# Patient Record
Sex: Female | Born: 1985 | Race: Black or African American | Hispanic: No | Marital: Single | Smoking: Former smoker
Health system: Southern US, Community
[De-identification: ages and names within clinical notes are randomized; demographics above are authoritative.]

## PROBLEM LIST (undated history)

## (undated) DIAGNOSIS — J45909 Unspecified asthma, uncomplicated: Secondary | ICD-10-CM

## (undated) HISTORY — PX: BREAST SURGERY: SHX581

---

## 2019-07-17 ENCOUNTER — Emergency Department (HOSPITAL_BASED_OUTPATIENT_CLINIC_OR_DEPARTMENT_OTHER)
Admission: EM | Admit: 2019-07-17 | Discharge: 2019-07-17 | Disposition: A | Discharge status: Home / Self Care | Payer: Self-pay | Attending: Emergency Medicine | Admitting: Emergency Medicine

## 2019-07-17 ENCOUNTER — Encounter (HOSPITAL_BASED_OUTPATIENT_CLINIC_OR_DEPARTMENT_OTHER): Payer: Self-pay

## 2019-07-17 ENCOUNTER — Emergency Department (HOSPITAL_BASED_OUTPATIENT_CLINIC_OR_DEPARTMENT_OTHER): Payer: Self-pay

## 2019-07-17 ENCOUNTER — Other Ambulatory Visit: Payer: Self-pay

## 2019-07-17 DIAGNOSIS — J45909 Unspecified asthma, uncomplicated: Secondary | ICD-10-CM | POA: Insufficient documentation

## 2019-07-17 DIAGNOSIS — R072 Precordial pain: Secondary | ICD-10-CM

## 2019-07-17 DIAGNOSIS — R0789 Other chest pain: Secondary | ICD-10-CM | POA: Insufficient documentation

## 2019-07-17 DIAGNOSIS — F17210 Nicotine dependence, cigarettes, uncomplicated: Secondary | ICD-10-CM | POA: Insufficient documentation

## 2019-07-17 DIAGNOSIS — R0602 Shortness of breath: Secondary | ICD-10-CM | POA: Insufficient documentation

## 2019-07-17 HISTORY — DX: Unspecified asthma, uncomplicated: J45.909

## 2019-07-17 MED ORDER — ALBUTEROL SULFATE HFA 108 (90 BASE) MCG/ACT IN AERS: 2.0000 | INHALATION_SPRAY | RESPIRATORY_TRACT | 1 refills | Status: AC | PRN

## 2019-07-17 NOTE — ED Triage Notes (Signed)
Pt arrived via [facility] from work where pt was smoking and started having chest pain and SOB. Ambulated inside and had someone call 911 for her. History of asthma. Pt is out of Albuterol inhaler. Was given 5 mg Albuterol Neb by EMS PTA.

## 2019-07-17 NOTE — Discharge Instructions (Signed)
Please read and follow all provided instructions.  Your diagnoses today include:  1. Shortness of breath   2. Precordial pain     Tests performed today include:  An EKG of your heart  A chest x-ray  Vital signs. See below for your results today.   Medications prescribed:   Albuterol inhaler - medication that opens up your airway  Use inhaler as follows: 1-2 puffs every 4 hours as needed for wheezing, cough, or shortness of breath.   Take any prescribed medications only as directed.  Follow-up instructions: Please follow-up with your primary care provider as soon as you can for further evaluation of your symptoms.   Return instructions:  SEEK IMMEDIATE MEDICAL ATTENTION IF:  You have severe chest pain, especially if the pain is crushing or pressure-like and spreads to the arms, back, neck, or jaw, or if you have sweating, nausea (feeling sick to your stomach), or shortness of breath. THIS IS AN EMERGENCY. Don't wait to see if the pain will go away. Get medical help at once. Call 911 or 0 (operator). DO NOT drive yourself to the hospital.   You wake from sleep with chest pain or shortness of breath.  You feel dizzy or faint.  You have chest pain not typical of your usual pain for which you originally saw your caregiver.   You have any other emergent concerns regarding your health.  Additional Information: Chest pain comes from many different causes. Your caregiver has diagnosed you as having chest pain that is not specific for one problem, but does not require admission.  You are at low risk for an acute heart condition or other serious illness.   Your vital signs today were: BP (!) 129/56 (BP Location: Right Arm)   Pulse 71   Temp 98.4 F (36.9 C) (Oral)   Resp (!) 24   Ht 5\' 3"  (1.6 m)   Wt 107 kg   SpO2 99%   BMI 41.81 kg/m  If your blood pressure (BP) was elevated above 135/85 this visit, please have this repeated by your doctor within one  month. --------------

## 2019-07-17 NOTE — ED Provider Notes (Signed)
[facility] EMERGENCY DEPARTMENT Provider Note   CSN: [id number] Arrival date & time: 07/17/19  1633     History Chief Complaint  Patient presents with  . Shortness of Breath    Alyssa Shepard is a 34 y.o. female.  Patient with no past cardiac history presents to the emergency department today for shortness of breath and chest pain.  Patient has a history of asthma and does not currently have an inhaler.  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  It was raining outside.  She was smoking a cigarette and had onset of mid chest pain with associated shortness of breath and wheezing.  EMS was called for transport.  Patient was given 5 mg of albuterol by EMS with resolution of symptoms.  Patient had a similar short-lived episode last night, again while smoking which resolved spontaneously.  Pain did not radiate.  There is no associated vomiting or diaphoresis.  Patient denies history of hypertension, high cholesterol, diabetes, family history of coronary artery disease. Patient denies risk factors for pulmonary embolism including: unilateral leg swelling, history of DVT/PE/other blood clots, use of exogenous hormones, recent immobilizations, recent surgery, recent travel (>4hr segment), malignancy, hemoptysis.  No abdominal pain.        Past Medical History:  Diagnosis Date  . Asthma     There are no problems to display for this patient.   Past Surgical History:  Procedure Laterality Date  . BREAST SURGERY       OB History   No obstetric history on file.     No family history on file.  Social History   Tobacco Use  . Smoking status: Current Every Day Smoker    Packs/day: 0.50    Types: Cigarettes  . Smokeless tobacco: Never Used  Substance Use Topics  . Alcohol use: Never  . Drug use: Never    Home Medications Prior to Admission medications   Not on File    Allergies    Patient has no known  allergies.  Review of Systems   Review of Systems  Constitutional: Negative for fever.  HENT: Negative for rhinorrhea and sore throat.   Eyes: Negative for redness.  Respiratory: Positive for shortness of breath and wheezing. Negative for cough.   Cardiovascular: Positive for chest pain.  Gastrointestinal: Negative for abdominal pain, diarrhea, nausea and vomiting.  Genitourinary: Negative for dysuria.  Musculoskeletal: Negative for myalgias.  Skin: Negative for rash.  Neurological: Negative for syncope, light-headedness and headaches.    Physical Exam Updated Vital Signs BP (!) 129/56 (BP Location: Right Arm)   Pulse 71   Temp 98.4 F (36.9 C) (Oral)   Resp (!) 24   Ht 5\' 3"  (1.6 m)   Wt 107 kg   SpO2 99%   BMI 41.81 kg/m   Physical Exam Vitals and nursing note reviewed.  Constitutional:      Appearance: She is well-developed. She is not diaphoretic.  HENT:     Head: Normocephalic and atraumatic.     Mouth/Throat:     Mouth: Mucous membranes are not dry.  Eyes:     Conjunctiva/sclera: Conjunctivae normal.  Neck:     Vascular: Normal carotid pulses. No carotid bruit or JVD.     Trachea: Trachea normal. No tracheal deviation.  Cardiovascular:     Rate and Rhythm: Normal rate and regular rhythm.     Pulses: No decreased pulses.     Heart sounds:  Normal heart sounds, S1 normal and S2 normal. No murmur.  Pulmonary:     Effort: Pulmonary effort is normal. No respiratory distress.     Breath sounds: No wheezing.  Chest:     Chest wall: No tenderness.  Abdominal:     General: Bowel sounds are normal.     Palpations: Abdomen is soft.     Tenderness: There is no abdominal tenderness. There is no guarding or rebound.  Musculoskeletal:        General: Normal range of motion.     Cervical back: Normal range of motion and neck supple. No muscular tenderness.  Skin:    General: Skin is warm and dry.     Coloration: Skin is not pale.  Neurological:     Mental Status:  She is alert.     ED Results / Procedures / Treatments   Labs (all labs ordered are listed, but only abnormal results are displayed) Labs Reviewed - No data to display  ED ECG REPORT   Date: 07/17/2019  Rate: 70  Rhythm: normal sinus rhythm  QRS Axis: normal  Intervals: normal  ST/T Wave abnormalities: normal  Conduction Disutrbances:none  Narrative Interpretation:   Old EKG Reviewed: none available  I have personally reviewed the EKG tracing and agree with the computerized printout as noted.  Radiology DG Chest 2 View  Result Date: 07/17/2019 CLINICAL DATA:  Sudden onset of mid-sternal chest pain just PTA while smoking, some SOB at that time, symptoms mostly resolved after albuterol treatment here Hx asthma EXAM: CHEST - 2 VIEW COMPARISON:  None. FINDINGS: The heart size and mediastinal contours are within normal limits. The lungs are clear. No pneumothorax or pleural effusion. The visualized skeletal structures are unremarkable. IMPRESSION: No acute cardiopulmonary finding. Electronically Signed   By: Emmaline Kluver M.D.   On: 07/17/2019 17:19    Procedures Procedures (including critical care time)  Medications Ordered in ED Medications - No data to display  ED Course  I have reviewed the triage vital signs and the nursing notes.  Pertinent labs & imaging results that were available during my care of the patient were reviewed by me and considered in my medical decision making (see chart for details).  Patient seen and examined. Work-up initiated.  Symptoms are resolved.  Will obtain chest x-ray to ensure no signs of pneumothorax or infection.  Will obtain EKG.  Symptoms are not suggestive of ACS and patient has low risk risk factor profile  Vital signs reviewed and are as follows: BP (!) 129/56 (BP Location: Right Arm)   Pulse 71   Temp 98.4 F (36.9 C) (Oral)   Resp (!) 24   Ht 5\' 3"  (1.6 m)   Wt 107 kg   SpO2 99%   BMI 41.81 kg/m   5:22 PM EKG and chest  x-ray are reassuring.  Plan for discharge home with albuterol inhaler.  Patient comfortable with plan.  Discussed low-risk for ACS, PE at this time.  Encourage PCP follow-up if symptoms persist.   Patient was counseled to return with severe chest pain, especially if the pain is crushing or pressure-like and spreads to the arms, back, neck, or jaw, or if they have sweating, nausea, or shortness of breath with the pain. They were encouraged to call 911 with these symptoms.     MDM Rules/Calculators/A&P                      Patient presents with complaint of  chest pain wheezing and shortness of breath which started while smoking.  Symptoms are atypical for ACS.  She has a low risk profile for this.  She does not have any signs or symptoms of DVT or PE.  PERC negative.  Patient's symptoms resolved with albuterol.  Suspect an element of reactive airways.  EKG is completely normal and chest x-ray without any signs of pneumothorax or other problems.  Patient remains symptom-free during ED stay.  Will have her follow-up as needed.  Rx for albuterol.  Discussed return instructions with patient as he seems reliable to return with any changes or worsening.   Final Clinical Impression(s) / ED Diagnoses Final diagnoses:  Shortness of breath  Precordial pain    Rx / DC Orders ED Discharge Orders         Ordered    albuterol (VENTOLIN HFA) 108 (90 Base) MCG/ACT inhaler  Every 4 hours PRN     07/17/19 1730           Carlisle Cater, PA-C 07/17/19 1734    Malvin Johns, MD 07/17/19 [phone]

## 2019-07-28 ENCOUNTER — Encounter (HOSPITAL_BASED_OUTPATIENT_CLINIC_OR_DEPARTMENT_OTHER): Payer: Self-pay

## 2019-07-28 ENCOUNTER — Other Ambulatory Visit: Payer: Self-pay

## 2019-07-28 DIAGNOSIS — N611 Abscess of the breast and nipple: Secondary | ICD-10-CM | POA: Insufficient documentation

## 2019-07-28 NOTE — ED Triage Notes (Addendum)
Pt c/o left breast abscess x 2 weeks-also c/o ?abscess to left cheek-also c/o HA-NAD-steady gait

## 2019-07-29 ENCOUNTER — Encounter (HOSPITAL_BASED_OUTPATIENT_CLINIC_OR_DEPARTMENT_OTHER): Payer: Self-pay | Admitting: Emergency Medicine

## 2019-07-29 ENCOUNTER — Emergency Department (HOSPITAL_BASED_OUTPATIENT_CLINIC_OR_DEPARTMENT_OTHER)
Admission: EM | Admit: 2019-07-29 | Discharge: 2019-07-29 | Disposition: A | Discharge status: Home / Self Care | Payer: Medicaid - Out of State | Attending: Emergency Medicine | Admitting: Emergency Medicine

## 2019-07-29 DIAGNOSIS — N611 Abscess of the breast and nipple: Secondary | ICD-10-CM

## 2019-07-29 MED ORDER — SULFAMETHOXAZOLE-TRIMETHOPRIM 800-160 MG PO TABS
1.0000 | ORAL_TABLET | Freq: Two times a day (BID) | ORAL | 0 refills | Status: DC
Start: 2019-07-29 — End: 2019-08-01

## 2019-07-29 MED ORDER — SULFAMETHOXAZOLE-TRIMETHOPRIM 800-160 MG PO TABS
1.0000 | ORAL_TABLET | Freq: Once | ORAL | Status: AC
  Administered 2019-07-29: 1 via ORAL
  Filled 2019-07-29: qty 1

## 2019-07-29 NOTE — ED Provider Notes (Signed)
[facility] EMERGENCY DEPARTMENT Provider Note   CSN: [id number] Arrival date & time: 07/28/19  2222     History Chief Complaint  Patient presents with  . Abscess    Alyssa Shepard is a 34 y.o. female.  The history is provided by the patient.  Abscess Abscess location: patient reports recurrent left breast cyst  Abscess quality: painful   Abscess quality: not draining, no itching, no redness, no warmth and not weeping   Red streaking: no   Duration:  2 weeks Progression:  Worsening Pain details:    Quality:  Dull   Severity:  Moderate   Duration:  2 weeks   Timing:  Constant Chronicity:  Recurrent Context: not diabetes   Relieved by:  Nothing Worsened by:  Nothing Ineffective treatments:  None tried Associated symptoms: no anorexia, no fatigue, no fever, no headaches, no nausea and no vomiting   Risk factors: no family hx of MRSA, no hx of MRSA and no prior abscess        Past Medical History:  Diagnosis Date  . Asthma     There are no problems to display for this patient.   Past Surgical History:  Procedure Laterality Date  . BREAST SURGERY       OB History   No obstetric history on file.     History reviewed. No pertinent family history.  Social History   Tobacco Use  . Smoking status: Former Smoker    Packs/day: 0.50    Types: Cigarettes    Quit date: 07/17/2019    Years since quitting: 0.0  . Smokeless tobacco: Never Used  Vaping Use  . Vaping Use: Never used  Substance Use Topics  . Alcohol use: Never  . Drug use: Never    Home Medications Prior to Admission medications   Medication Sig Start Date End Date Taking? Authorizing Provider  albuterol (VENTOLIN HFA) 108 (90 Base) MCG/ACT inhaler Inhale 2 puffs into the lungs every 4 (four) hours as needed for wheezing or shortness of breath. 07/17/19   Renne Crigler, PA-C    Allergies    Patient has no known allergies.  Review of Systems   Review of Systems    Constitutional: Negative for fatigue and fever.  HENT: Negative for congestion.   Eyes: Negative for visual disturbance.  Respiratory: Negative for shortness of breath.   Cardiovascular: Negative for chest pain.  Gastrointestinal: Negative for anorexia, nausea and vomiting.  Genitourinary: Negative for difficulty urinating.  Musculoskeletal: Negative for arthralgias.  Skin: Negative for rash.  Neurological: Negative for headaches.  Psychiatric/Behavioral: Negative for agitation.  All other systems reviewed and are negative.   Physical Exam Updated Vital Signs BP 125/87 (BP Location: Right Arm)   Pulse 89   Temp 98.6 F (37 C) (Oral)   Resp 18   Ht 5\' 3"  (1.6 m)   Wt 105.2 kg   SpO2 100%   BMI 41.10 kg/m   Physical Exam Vitals and nursing note reviewed. Exam conducted with a chaperone present.  Constitutional:      General: She is not in acute distress.    Appearance: Normal appearance.  HENT:     Head: Normocephalic and atraumatic.     Nose: Nose normal.  Eyes:     Conjunctiva/sclera: Conjunctivae normal.     Pupils: Pupils are equal, round, and reactive to light.  Cardiovascular:     Rate and Rhythm: Normal rate and regular rhythm.     Pulses: Normal pulses.  Heart sounds: Normal heart sounds.  Pulmonary:     Effort: Pulmonary effort is normal.     Breath sounds: Normal breath sounds.  Chest:     Breasts:        Right: No inverted nipple.        Left: No swelling, bleeding, inverted nipple, mass or nipple discharge.    Abdominal:     General: Abdomen is flat. Bowel sounds are normal.     Tenderness: There is no abdominal tenderness. There is no guarding.  Musculoskeletal:        General: Normal range of motion.     Cervical back: Normal range of motion and neck supple.  Skin:    General: Skin is warm and dry.     Capillary Refill: Capillary refill takes less than 2 seconds.  Neurological:     General: No focal deficit present.     Mental Status: She  is alert and oriented to person, place, and time.     Deep Tendon Reflexes: Reflexes normal.  Psychiatric:        Mood and Affect: Mood normal.        Behavior: Behavior normal.     ED Results / Procedures / Treatments   Labs (all labs ordered are listed, but only abnormal results are displayed) Labs Reviewed - No data to display  EKG None  Radiology No results found.  Procedures Procedures (including critical care time)  Medications Ordered in ED Medications  sulfamethoxazole-trimethoprim (BACTRIM DS) 800-160 MG per tablet 1 tablet (has no administration in time range)    ED Course  I have reviewed the triage vital signs and the nursing notes.  Pertinent labs & imaging results that were available during my care of the patient were reviewed by me and considered in my medical decision making (see chart for details).   [facility] for imaging and further care of recurrent breast abscesses.  Will start antibiotics.    Alyssa Shepard was evaluated in Emergency Department on 07/29/2019 for the symptoms described in the history of present illness. She was evaluated in the context of the global COVID-19 pandemic, which necessitated consideration that the patient might be at risk for infection with the SARS-CoV-2 virus that causes COVID-19. Institutional protocols and algorithms that pertain to the evaluation of patients at risk for COVID-19 are in a state of rapid change based on information released by regulatory bodies including the CDC and federal and state organizations. These policies and algorithms were followed during the patient's care in the ED.   MDM Rules/Calculators/A&P                         Return for intractable cough, coughing up blood,fevers >100.4 unrelieved by medication, shortness of breath, intractable vomiting, chest pain, shortness of breath, weakness,numbness, changes in speech, facial  asymmetry,abdominal pain, passing out,Inability to tolerate liquids or food, cough, altered mental status or any concerns. No signs of systemic illness or infection. The patient is nontoxic-appearing on exam and vital signs are within normal limits.   I have reviewed the triage vital signs and the nursing notes. Pertinent labs &imaging results that were available during my care of the patient were reviewed by me and considered in my medical decision making (see chart for details).After history, exam, and medical workup I feel the patient has beenappropriately medically screened and is safe for discharge home. Pertinent diagnoses were discussed with  the patient. Patient was given return precautions.     Elsie Sakuma, MD 07/29/19 [phone]

## 2019-08-01 ENCOUNTER — Other Ambulatory Visit: Payer: Self-pay

## 2019-08-01 ENCOUNTER — Encounter (HOSPITAL_COMMUNITY): Payer: Self-pay | Admitting: Emergency Medicine

## 2019-08-01 ENCOUNTER — Emergency Department (HOSPITAL_COMMUNITY)
Admission: EM | Admit: 2019-08-01 | Discharge: 2019-08-01 | Disposition: A | Discharge status: Home / Self Care | Payer: Medicaid - Out of State | Attending: Emergency Medicine | Admitting: Emergency Medicine

## 2019-08-01 DIAGNOSIS — Z87891 Personal history of nicotine dependence: Secondary | ICD-10-CM | POA: Insufficient documentation

## 2019-08-01 DIAGNOSIS — J45909 Unspecified asthma, uncomplicated: Secondary | ICD-10-CM | POA: Insufficient documentation

## 2019-08-01 DIAGNOSIS — N6320 Unspecified lump in the left breast, unspecified quadrant: Secondary | ICD-10-CM | POA: Insufficient documentation

## 2019-08-01 DIAGNOSIS — N611 Abscess of the breast and nipple: Secondary | ICD-10-CM | POA: Diagnosis not present

## 2019-08-01 DIAGNOSIS — N644 Mastodynia: Secondary | ICD-10-CM | POA: Diagnosis present

## 2019-08-01 MED ORDER — ACETAMINOPHEN 500 MG PO TABS: 500.0000 mg | ORAL_TABLET | Freq: Four times a day (QID) | ORAL | 0 refills | Status: AC | PRN

## 2019-08-01 MED ORDER — IBUPROFEN 200 MG PO TABS
600.0000 mg | ORAL_TABLET | Freq: Once | ORAL | Status: AC
  Administered 2019-08-01: 600 mg via ORAL
  Filled 2019-08-01: qty 3

## 2019-08-01 MED ORDER — IBUPROFEN 600 MG PO TABS: 600.0000 mg | ORAL_TABLET | Freq: Four times a day (QID) | ORAL | 0 refills | Status: AC | PRN

## 2019-08-01 MED ORDER — SULFAMETHOXAZOLE-TRIMETHOPRIM 800-160 MG PO TABS
1.0000 | ORAL_TABLET | Freq: Two times a day (BID) | ORAL | 0 refills | Status: AC
Start: 2019-08-01 — End: 2019-08-08

## 2019-08-01 MED ORDER — SULFAMETHOXAZOLE-TRIMETHOPRIM 800-160 MG PO TABS
1.0000 | ORAL_TABLET | Freq: Once | ORAL | Status: AC
  Administered 2019-08-01: 1 via ORAL
  Filled 2019-08-01: qty 1

## 2019-08-01 NOTE — ED Triage Notes (Signed)
t reports for past 2 weeks had swelling to left breast. Reports pain and size gotten worse. Was seen at [facility] and told needs to see a Careers adviser.

## 2019-08-01 NOTE — Discharge Instructions (Signed)
Please take all of your antibiotics until finished!   Take your antibiotics with food.  Common side effects of antibiotics include nausea, vomiting, abdominal discomfort, and diarrhea. You may help offset some of this with probiotics which you can buy or get in yogurt. Do not eat  or take the probiotics until 2 hours after your antibiotic.    Some studies suggest that certain antibiotics can reduce the efficacy of certain oral contraceptive pills (birth control), so please use additional contraceptives (condoms or other barrier method) while you are taking the antibiotics and for an additional 5 to 7 days afterwards if you are a female on these medications.  Alternate 600 mg of ibuprofen and [phone] mg of Tylenol every 3 hours as needed for pain. Do not exceed 4000 mg of Tylenol daily.  Take ibuprofen with food to avoid upset stomach issues.  Apply warm or cool compresses throughout the day whichever feels better.  Please call the breast center and the general surgeon tomorrow first thing in the morning to schedule follow-up.  Let them know that we did a bedside ultrasound which shows a likely abscess that will require further evaluation and drainage.  Return to the emergency department if any concerning signs or symptoms develop such as fevers, vomiting, chest pain, shortness of breath, worsening spread of redness.

## 2019-08-01 NOTE — ED Provider Notes (Signed)
[facility] DEPT Provider Note   CSN: [id number] Arrival date & time: 08/01/19  1412     History Chief Complaint  Patient presents with  . Breast Pain    left    Alyssa Shepard is a 34 y.o. female with history of asthma presents for evaluation of acute onset, progressively worsening left breast pain and swelling for 2 weeks.  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  She reports symptoms feel similar to that time.  Reports sharp pains that worsen primarily with movement and lifting while at work at the [employer] but improves with rest.  She has not taken anything for her symptoms but has been applying warm compresses with little bit of relief.  No drainage from the area or nipple drainage per her report.  She denies chest pain, shortness of breath, fevers, abdominal pain, nausea, or vomiting.  She was seen at [facility] for this 3 days ago and was instructed to follow-up with general surgery and PCP outpatient but reports that she was unable to get through on the phone to set up the appointments.  She was prescribed Bactrim when she was seen at [facility] 3 days ago but reports that she did not fill the antibiotic because "I did not think it would help".  She is a current smoker.  She reports 2 aunts have a history of breast cancer but no first-degree relatives.  The history is provided by the patient.       Past Medical History:  Diagnosis Date  . Asthma     There are no problems to display for this patient.   Past Surgical History:  Procedure Laterality Date  . BREAST SURGERY       OB History   No obstetric history on file.     No family history on file.  Social History   Tobacco Use  . Smoking status: Former Smoker    Packs/day: 0.50    Types: Cigarettes    Quit date: 07/17/2019    Years since quitting: 0.0  .  Smokeless tobacco: Never Used  Vaping Use  . Vaping Use: Never used  Substance Use Topics  . Alcohol use: Never  . Drug use: Never    Home Medications Prior to Admission medications   Medication Sig Start Date End Date Taking? Authorizing Provider  acetaminophen (TYLENOL) 500 MG tablet Take 1 tablet (500 mg total) by mouth every 6 (six) hours as needed. 08/01/19   Farris Geiman A, PA-C  albuterol (VENTOLIN HFA) 108 (90 Base) MCG/ACT inhaler Inhale 2 puffs into the lungs every 4 (four) hours as needed for wheezing or shortness of breath. 07/17/19   Carlisle Cater, PA-C  ibuprofen (ADVIL) 600 MG tablet Take 1 tablet (600 mg total) by mouth every 6 (six) hours as needed. 08/01/19   Phyllis Whitefield A, PA-C  sulfamethoxazole-trimethoprim (BACTRIM DS) 800-160 MG tablet Take 1 tablet by mouth 2 (two) times daily for 7 days. 08/01/19 08/08/19  Rodell Perna A, PA-C    Allergies    Patient has no known allergies.  Review of Systems   Review of Systems  Constitutional: Negative for chills and fever.  Respiratory: Negative for shortness of breath.   Cardiovascular: Negative for chest pain.  Gastrointestinal: Negative for abdominal pain, diarrhea, nausea and vomiting.  Genitourinary:       +breast pain  All other systems reviewed and are negative.   Physical Exam Updated Vital Signs BP (!) 149/79   Pulse 86   Temp 99.2 F (37.3 C) (Oral)   Resp 16   Ht 5\' 3"  (1.6 m)   Wt 104.3 kg   SpO2 100%   BMI 40.74 kg/m   Physical Exam Vitals and nursing note reviewed. Exam conducted with a chaperone present.  Constitutional:      General: She is not in acute distress.    Appearance: She is well-developed.     Comments: Resting comfortably in bed  HENT:     Head: Normocephalic and atraumatic.  Eyes:     General:        Right eye: No discharge.        Left eye: No discharge.     Conjunctiva/sclera: Conjunctivae normal.  Neck:     Vascular: No JVD.     Trachea: No tracheal deviation.    Cardiovascular:     Rate and Rhythm: Normal rate and regular rhythm.  Pulmonary:     Effort: Pulmonary effort is normal.     Breath sounds: Normal breath sounds.  Chest:     Chest wall: Mass, swelling and tenderness present.     Breasts:        Right: No inverted nipple or nipple discharge.        Left: Tenderness present. No inverted nipple or nipple discharge.     Comments: Examination performed in the presence of a chaperone.  Patient has a 13 x 9 cm area of induration and tenderness involving the left areola extending from the 7 o'clock to 12 o'clock position.  There is some underlying fluctuance and some surrounding erythema but no streaking.  No nipple inversion or drainage. Abdominal:     General: Bowel sounds are normal. There is no distension.     Palpations: Abdomen is soft.     Tenderness: There is no abdominal tenderness. There is no guarding or rebound.  Lymphadenopathy:     Upper Body:     Right upper body: No supraclavicular, axillary or pectoral adenopathy.     Left upper body: No supraclavicular, axillary or pectoral adenopathy.  Skin:    Findings: No erythema.  Neurological:     Mental Status: She is alert.  Psychiatric:        Behavior: Behavior normal.     ED Results / Procedures / Treatments   Labs (all labs ordered are listed, but only abnormal results are displayed) Labs Reviewed - No data to display  EKG None  Radiology No results found.  Procedures Procedures (including critical care time)  Medications Ordered in ED Medications  ibuprofen (ADVIL) tablet 600 mg (600 mg Oral Given 08/01/19 2129)  sulfamethoxazole-trimethoprim (BACTRIM DS) 800-160 MG per tablet 1 tablet (1 tablet Oral Given 08/01/19 2129)    ED Course  I have reviewed the triage vital signs and the nursing notes.  Pertinent labs & imaging results that were available during my care of the patient were reviewed by me and considered in my medical decision making (see chart for  details).  Clinical Course as of Aug 02 27  Sun Aug 01, 2019  2834 34 year old female present emerge department of left breast abscess.  Ongoing for about 2 weeks.  She had abscesses here before that if needed to be surgically drained, although she has recently moved to this area.  On exam she does have a large area of erythema and fluctuance directly  underneath and medial to the left areola.  Bedside ultrasound showed a hypoechoic collection at his deepest measuring 3 cm below the areola, consistent with an abscess.  Given the location and extent of this abscess, I do think she will need to be referred to general surgeon for drainage.  Strongly advised that she complies with her Bactrim regimen, which she says she will.  Okay for discharge   [MT]    Clinical Course User Index [MT] Trifan, Kermit Balo, MD   MDM Rules/Calculators/A&P                          Patient presenting for evaluation of persistent left breast pains.  Has a history of recurrent breast abscesses and states this feels similarly.  She is afebrile, vital signs are stable.  She is nontoxic in appearance.  No constitutional symptoms and she does not appear overtly septic at this time.  Bedside ultrasound performed by Dr. Renaye Rakers confirms what appears to be an underlying abscess.  Physical examination is not concerning for breast cancer.  I informed the patient that we do not perform bedside I&D's of breast abscesses in the ED but that she should follow up with general surgery for the breast center on an outpatient basis and I will provide her with the resources to follow-up.  She was prescribed Bactrim when she was seen at [facility] 3 days ago but did not get this medication filled.  I discussed the importance of taking the antibiotic to completion and we also discussed pain management with NSAIDs, Tylenol, warm or cool compresses as tolerated.  Discussed strict ED return precautions. Patient  verbalized understanding of and  agreement with plan and is safe for discharge home at this time.  Final Clinical Impression(s) / ED Diagnoses Final diagnoses:  Breast abscess    Rx / DC Orders ED Discharge Orders         Ordered    sulfamethoxazole-trimethoprim (BACTRIM DS) 800-160 MG tablet  2 times daily     Discontinue  Reprint     08/01/19 2110    ibuprofen (ADVIL) 600 MG tablet  Every 6 hours PRN     Discontinue  Reprint     08/01/19 2110    acetaminophen (TYLENOL) 500 MG tablet  Every 6 hours PRN     Discontinue  Reprint     08/01/19 2110    Ambulatory referral to Breast Clinic     Discontinue  Reprint    Comments: Breast abscess, recurrent   08/01/19 2112           Jeanie Sewer, PA-C 08/02/19 0031    Terald Sleeper, MD 08/02/19 1321

## 2019-08-01 NOTE — ED Provider Notes (Signed)
Ultrasound ED Soft Tissue  Date/Time: 08/01/2019 9:05 PM Performed by: Terald Sleeper, MD Authorized by: Terald Sleeper, MD   Procedure details:    Indications: localization of abscess     Transverse view:  Visualized   Longitudinal view:  Visualized   Images: archived   Location:    Location: breast     Side:  Left Findings:     abscess present Comments:     Extending 3 cm at deepest underneath areola     Terald Sleeper, MD 08/01/19 2105

## 2019-08-02 ENCOUNTER — Other Ambulatory Visit: Payer: Self-pay | Admitting: Student

## 2019-08-02 DIAGNOSIS — N611 Abscess of the breast and nipple: Secondary | ICD-10-CM

## 2019-08-03 ENCOUNTER — Ambulatory Visit
Admission: RE | Admit: 2019-08-03 | Discharge: 2019-08-03 | Disposition: A | Discharge status: Home / Self Care | Payer: PRIVATE HEALTH INSURANCE | Source: Ambulatory Visit | Attending: Student | Admitting: Student

## 2019-08-03 ENCOUNTER — Other Ambulatory Visit: Payer: Self-pay

## 2019-08-03 ENCOUNTER — Ambulatory Visit
Admission: RE | Admit: 2019-08-03 | Discharge: 2019-08-03 | Disposition: A | Discharge status: Home / Self Care | Payer: Self-pay | Source: Ambulatory Visit | Attending: Student | Admitting: Student

## 2019-08-03 ENCOUNTER — Other Ambulatory Visit: Payer: Self-pay | Admitting: Student

## 2019-08-03 DIAGNOSIS — N611 Abscess of the breast and nipple: Secondary | ICD-10-CM

## 2019-08-03 DIAGNOSIS — N6310 Unspecified lump in the right breast, unspecified quadrant: Secondary | ICD-10-CM

## 2020-03-21 ENCOUNTER — Encounter (HOSPITAL_BASED_OUTPATIENT_CLINIC_OR_DEPARTMENT_OTHER): Payer: Self-pay | Admitting: *Deleted

## 2020-03-21 ENCOUNTER — Other Ambulatory Visit: Payer: Self-pay

## 2020-03-21 ENCOUNTER — Emergency Department (HOSPITAL_BASED_OUTPATIENT_CLINIC_OR_DEPARTMENT_OTHER)
Admission: EM | Admit: 2020-03-21 | Discharge: 2020-03-21 | Disposition: A | Discharge status: Home / Self Care | Payer: Medicaid - Out of State | Attending: Emergency Medicine | Admitting: Emergency Medicine

## 2020-03-21 DIAGNOSIS — J040 Acute laryngitis: Secondary | ICD-10-CM | POA: Insufficient documentation

## 2020-03-21 DIAGNOSIS — J029 Acute pharyngitis, unspecified: Secondary | ICD-10-CM

## 2020-03-21 DIAGNOSIS — J45909 Unspecified asthma, uncomplicated: Secondary | ICD-10-CM | POA: Insufficient documentation

## 2020-03-21 DIAGNOSIS — Z87891 Personal history of nicotine dependence: Secondary | ICD-10-CM | POA: Insufficient documentation

## 2020-03-21 LAB — GROUP A STREP BY PCR: Group A Strep by PCR: NOT DETECTED

## 2020-03-21 MED ORDER — DEXAMETHASONE SODIUM PHOSPHATE 10 MG/ML IJ SOLN
10.0000 mg | Freq: Once | INTRAMUSCULAR | Status: AC
  Administered 2020-03-21: 10 mg via INTRAMUSCULAR
  Filled 2020-03-21: qty 1

## 2020-03-21 NOTE — ED Provider Notes (Signed)
Emergency Department Provider Note   I have reviewed the triage vital signs and the nursing notes.   HISTORY  Chief Complaint Sore Throat   HPI Alyssa Shepard is a 35 y.o. female with past medical history of asthma presents to the emergency department with hoarse voice and sore throat over the past 4 days.  She reports feeling "hot and cold" at times but no fever.  She tested positive for Covid last month on January 7 and has made a full recovery.  She denies any chest pain or shortness of breath symptoms.  Denies body aches.  She is able to eat and drink without difficulty.  She denies any shortness of breath.  No sensation of throat or tongue swelling.   Past Medical History:  Diagnosis Date  . Asthma     There are no problems to display for this patient.   Past Surgical History:  Procedure Laterality Date  . BREAST SURGERY      Allergies Patient has no known allergies.  No family history on file.  Social History Social History   Tobacco Use  . Smoking status: Former Smoker    Packs/day: 0.50    Types: Cigarettes    Quit date: 07/17/2019    Years since quitting: 0.6  . Smokeless tobacco: Never Used  Vaping Use  . Vaping Use: Never used  Substance Use Topics  . Alcohol use: Never  . Drug use: Never    Review of Systems  Constitutional: No fever/chills Eyes: No visual changes. ENT: Positive sore throat and hoarseness.  Cardiovascular: Denies chest pain. Respiratory: Denies shortness of breath. Gastrointestinal: No abdominal pain.  No nausea, no vomiting.  No diarrhea.  No constipation. Genitourinary: Negative for dysuria. Musculoskeletal: Negative for back pain. Skin: Negative for rash. Neurological: Negative for headaches, focal weakness or numbness.  10-point ROS otherwise negative.  ____________________________________________   PHYSICAL EXAM:  VITAL SIGNS: ED Triage Vitals [03/21/20 0412]  Enc Vitals Group     BP 119/68     Pulse  Rate 77     Resp 18     Temp 98.1 F (36.7 C)     Temp Source Oral     SpO2 97 %     Weight 242 lb (109.8 kg)     Height 5\' 3"  (1.6 m)   Constitutional: Alert and oriented. Well appearing and in no acute distress. Eyes: Conjunctivae are normal. Head: Atraumatic. Nose: No congestion/rhinnorhea. Mouth/Throat: Mucous membranes are moist.  Oropharynx with mild erythema. No exudate. No PTA. Managing oral secretions.  Neck: No stridor.   Cardiovascular: Normal rate, regular rhythm. Good peripheral circulation. Grossly normal heart sounds.   Respiratory: Normal respiratory effort.  No retractions. Lungs CTAB. Gastrointestinal: No distention.  Musculoskeletal: No gross deformities of extremities. Neurologic:  Normal speech and language.  Skin:  Skin is warm, dry and intact. No rash noted. ____________________________________________   LABS (all labs ordered are listed, but only abnormal results are displayed)  Labs Reviewed  GROUP A STREP BY PCR   ____________________________________________   PROCEDURES  Procedure(s) performed:   Procedures  None ____________________________________________   INITIAL IMPRESSION / ASSESSMENT AND PLAN / ED COURSE  Pertinent labs & imaging results that were available during my care of the patient were reviewed by me and considered in my medical decision making (see chart for details).   Patient presents with laryngitis symptoms. No signs on exam to suspect deep space infection. No visible PTA. Strep negative. No DM or HTN history.  Plan for supportive care and decadron here. Discussed PCP follow up plan and ED return precautions.    ____________________________________________  FINAL CLINICAL IMPRESSION(S) / ED DIAGNOSES  Final diagnoses:  Laryngitis  Sore throat     MEDICATIONS GIVEN DURING THIS VISIT:  Medications  dexamethasone (DECADRON) injection 10 mg (10 mg Intramuscular Given 03/21/20 1062)     Note:  This document was  prepared using Dragon voice recognition software and may include unintentional dictation errors.  Alona Bene, MD, [facility] Emergency Medicine    Long, Arlyss Repress, MD 03/21/20 1800

## 2020-03-21 NOTE — Discharge Instructions (Signed)
You were seen in the emergency room today with sore throat and loss of voice.  This is likely from laryngitis caused by a virus.  Your strep test today was negative and so antibiotics would not likely help in this situation.  Your voice will return with time.  You may take Tylenol and or Motrin by following instructions on the back of the box for any discomfort.  I have also given a single dose of steroid here which will help reduce inflammation and sore throat over the next several days.  Return to the emergency department any new or suddenly worsening symptoms.

## 2020-03-21 NOTE — ED Triage Notes (Addendum)
Pt states lost her voice and sore throat x 4 days. Chills today. No meds for the same. COVID+ January 7

## 2020-03-29 ENCOUNTER — Other Ambulatory Visit: Payer: Self-pay

## 2020-03-29 ENCOUNTER — Ambulatory Visit (HOSPITAL_COMMUNITY)
Admission: EM | Admit: 2020-03-29 | Discharge: 2020-03-29 | Disposition: A | Discharge status: Home / Self Care | Payer: Self-pay | Attending: Emergency Medicine | Admitting: Emergency Medicine

## 2020-03-29 ENCOUNTER — Encounter (HOSPITAL_COMMUNITY): Payer: Self-pay

## 2020-03-29 DIAGNOSIS — J029 Acute pharyngitis, unspecified: Secondary | ICD-10-CM

## 2020-03-29 DIAGNOSIS — J040 Acute laryngitis: Secondary | ICD-10-CM

## 2020-03-29 MED ORDER — OMEPRAZOLE 20 MG PO CPDR: 20.0000 mg | DELAYED_RELEASE_CAPSULE | Freq: Every day | ORAL | 0 refills | Status: AC

## 2020-03-29 MED ORDER — PREDNISONE 10 MG (21) PO TBPK
ORAL_TABLET | Freq: Every day | ORAL | 0 refills | Status: AC
Start: 2020-03-29 — End: ?

## 2020-03-29 NOTE — Discharge Instructions (Addendum)
Warm beverages/ tea to help soothe the throat.  Throat lozenges, gargles, chloraseptic spray, warm teas, popsicles etc to help with throat pain.   Course of oral steroids.  Omeprazole daily, as sometimes some silent reflux can trigger similar symptoms.  Limit speaking best you can to allow for healing.  Decrease to quit smoking which can also help.  Please follow up with ENT if persistent as will need further evaluation.

## 2020-03-29 NOTE — ED Provider Notes (Signed)
[facility]    CSN: [id number] Arrival date & time: 03/29/20  1132      History   Chief Complaint Chief Complaint  Patient presents with  . Sore Throat  . hoarse voice    HPI Alyssa Shepard is a 35 y.o. female.   Alyssa Shepard presents with complaints of persistent laryngitis and sore throat. She went to the ER on 2/1 with negative strep testing at that time and was given decadron im. Her symptoms resolved for a few days before returning. No fevers, cough, nasal drainage, ear pain, headache or body aches. Denies any previous similar. No difficulty swallowing. Pain with swallowing. Pain with pushing her tongue out, so some movements can cause pain to posterior tongue. She does smoke. No obvious acid reflux symptoms.     ROS per HPI, negative if not otherwise mentioned.      Past Medical History:  Diagnosis Date  . Asthma     There are no problems to display for this patient.   Past Surgical History:  Procedure Laterality Date  . BREAST SURGERY      OB History   No obstetric history on file.      Home Medications    Prior to Admission medications   Medication Sig Start Date End Date Taking? Authorizing Provider  omeprazole (PRILOSEC) 20 MG capsule Take 1 capsule (20 mg total) by mouth daily. 03/29/20  Yes Bisma Klett, Dorene Grebe B, NP  predniSONE (STERAPRED UNI-PAK 21 TAB) 10 MG (21) TBPK tablet Take by mouth daily. Per box instruction 03/29/20  Yes Linus Mako B, NP  acetaminophen (TYLENOL) 500 MG tablet Take 1 tablet (500 mg total) by mouth every 6 (six) hours as needed. 08/01/19   Fawze, Mina A, PA-C  albuterol (VENTOLIN HFA) 108 (90 Base) MCG/ACT inhaler Inhale 2 puffs into the lungs every 4 (four) hours as needed for wheezing or shortness of breath. 07/17/19   Renne Crigler, PA-C  ibuprofen (ADVIL) 600 MG tablet Take 1 tablet (600 mg total) by mouth every 6 (six) hours as needed. 08/01/19   Jeanie Sewer, PA-C    Family History History reviewed.  No pertinent family history.  Social History Social History   Tobacco Use  . Smoking status: Former Smoker    Packs/day: 0.50    Types: Cigarettes    Quit date: 07/17/2019    Years since quitting: 0.7  . Smokeless tobacco: Never Used  Vaping Use  . Vaping Use: Never used  Substance Use Topics  . Alcohol use: Never  . Drug use: Never     Allergies   Patient has no known allergies.   Review of Systems Review of Systems   Physical Exam Triage Vital Signs ED Triage Vitals  Enc Vitals Group     BP 03/29/20 1200 128/84     Pulse Rate 03/29/20 1200 78     Resp 03/29/20 1200 19     Temp 03/29/20 1200 98.4 F (36.9 C)     Temp src --      SpO2 03/29/20 1200 97 %     Weight --      Height --      Head Circumference --      Peak Flow --      Pain Score 03/29/20 1158 4     Pain Loc --      Pain Edu? --      Excl. in GC? --    No data found.  Updated Vital Signs BP  128/84   Pulse 78   Temp 98.4 F (36.9 C)   Resp 19   SpO2 97%   Visual Acuity Right Eye Distance:   Left Eye Distance:   Bilateral Distance:    Right Eye Near:   Left Eye Near:    Bilateral Near:     Physical Exam Constitutional:      General: She is not in acute distress.    Appearance: She is well-developed.  HENT:     Mouth/Throat:     Mouth: Mucous membranes are moist. No oral lesions.     Pharynx: Uvula midline. No oropharyngeal exudate, posterior oropharyngeal erythema or uvula swelling.     Tonsils: No tonsillar exudate.     Comments: Hoarse voice noted without any visible abnormal findings to oral mucosa, tongue or posterior oropharynx  Cardiovascular:     Rate and Rhythm: Normal rate.  Pulmonary:     Effort: Pulmonary effort is normal.  Skin:    General: Skin is warm and dry.  Neurological:     Mental Status: She is alert and oriented to person, place, and time.      UC Treatments / Results  Labs (all labs ordered are listed, but only abnormal results are  displayed) Labs Reviewed - No data to display  EKG   Radiology No results found.  Procedures Procedures (including critical care time)  Medications Ordered in UC Medications - No data to display  Initial Impression / Assessment and Plan / UC Course  I have reviewed the triage vital signs and the nursing notes.  Pertinent labs & imaging results that were available during my care of the patient were reviewed by me and considered in my medical decision making (see chart for details).     Viral laryngitis? Additional steroids provided via pred pack. Will try daily omeprazole as well. ENT follow up if persistent or worsening. encouraged to decrease to quit smoking. Patient verbalized understanding and agreeable to plan.   Final Clinical Impressions(s) / UC Diagnoses   Final diagnoses:  Laryngitis  Pharyngitis, unspecified etiology     Discharge Instructions     Warm beverages/ tea to help soothe the throat.  Throat lozenges, gargles, chloraseptic spray, warm teas, popsicles etc to help with throat pain.   Course of oral steroids.  Omeprazole daily, as sometimes some silent reflux can trigger similar symptoms.  Limit speaking best you can to allow for healing.  Decrease to quit smoking which can also help.  Please follow up with ENT if persistent as will need further evaluation.    ED Prescriptions    Medication Sig Dispense Auth. Provider   predniSONE (STERAPRED UNI-PAK 21 TAB) 10 MG (21) TBPK tablet Take by mouth daily. Per box instruction 21 tablet Linus Mako B, NP   omeprazole (PRILOSEC) 20 MG capsule Take 1 capsule (20 mg total) by mouth daily. 30 capsule Georgetta Haber, NP     PDMP not reviewed this encounter.   Georgetta Haber, NP 03/29/20 1214

## 2020-03-29 NOTE — ED Triage Notes (Signed)
Pt in with c/o S and hoarse voice that has been going on for 2 weeks now. States that it feels like her gland are swollen.Also c/o tongue pain  Pt was seen in ED approx 2 weeks ago and given a steroid shot that helped for a few days but sxs returned

## 2020-07-19 ENCOUNTER — Ambulatory Visit (HOSPITAL_COMMUNITY)
Admission: EM | Admit: 2020-07-19 | Discharge: 2020-07-19 | Disposition: A | Discharge status: Home / Self Care | Payer: Self-pay | Attending: Emergency Medicine | Admitting: Emergency Medicine

## 2020-07-19 ENCOUNTER — Encounter (HOSPITAL_COMMUNITY): Payer: Self-pay

## 2020-07-19 ENCOUNTER — Other Ambulatory Visit: Payer: Self-pay

## 2020-07-19 DIAGNOSIS — N898 Other specified noninflammatory disorders of vagina: Secondary | ICD-10-CM

## 2020-07-19 DIAGNOSIS — Z20822 Contact with and (suspected) exposure to covid-19: Secondary | ICD-10-CM | POA: Insufficient documentation

## 2020-07-19 DIAGNOSIS — J020 Streptococcal pharyngitis: Secondary | ICD-10-CM

## 2020-07-19 DIAGNOSIS — J029 Acute pharyngitis, unspecified: Secondary | ICD-10-CM | POA: Insufficient documentation

## 2020-07-19 DIAGNOSIS — H9201 Otalgia, right ear: Secondary | ICD-10-CM | POA: Insufficient documentation

## 2020-07-19 DIAGNOSIS — L299 Pruritus, unspecified: Secondary | ICD-10-CM | POA: Insufficient documentation

## 2020-07-19 DIAGNOSIS — Z87891 Personal history of nicotine dependence: Secondary | ICD-10-CM | POA: Insufficient documentation

## 2020-07-19 LAB — SARS CORONAVIRUS 2 (TAT 6-24 HRS): SARS Coronavirus 2: NEGATIVE

## 2020-07-19 LAB — POCT RAPID STREP A, ED / UC: Streptococcus, Group A Screen (Direct): POSITIVE — AB

## 2020-07-19 MED ORDER — AMOXICILLIN 500 MG PO CAPS: 500.0000 mg | ORAL_CAPSULE | Freq: Two times a day (BID) | ORAL | 0 refills | Status: AC

## 2020-07-19 MED ORDER — FLUTICASONE PROPIONATE 50 MCG/ACT NA SUSP: 2.0000 | Freq: Two times a day (BID) | NASAL | 2 refills | Status: AC

## 2020-07-19 NOTE — Discharge Instructions (Addendum)
Take antibiotic twice a day for 10 days   Can use nasal spray once in the morning, once in the evening as needed to help clear sinus passageways  Can attempt salt water gargles, hard candies, hot and cold liquids, over-the-counter Chloraseptic spray for comfort  COVID test pending 24 hours, you will call if positive   STI screening pending 2 to 3 days, you will be called if positive for treatment  Do not have sex until lab results, if positive do not have sex until treatment complete, if positive please notify partner so that they may be tested as well

## 2020-07-19 NOTE — ED Triage Notes (Signed)
Pt in with c/o right side ear pain and st x 4 days  Pt has taken tylenol with no relief

## 2020-07-19 NOTE — ED Provider Notes (Signed)
[facility]    CSN: [id number] Arrival date & time: 07/19/20  0844      History   Chief Complaint Chief Complaint  Patient presents with  . Ear Pain  . Sore Throat    HPI Alyssa Shepard is a 35 y.o. female.   Patient presents with right-sided throat pain making it hard to swallow and right-sided ear pain for 4 days.  Had fever yesterday but resolved spontaneously.  Denies body aches, chills, headache, nasal congestion, rhinorrhea, shortness of breath, abdominal pain, nausea, vomiting, diarrhea.  Has taken Tylenol with no relief.  Took old amoxicillin pills for one day. Denies known sick contact.  Presents with vaginal itching, abdominal pressure and urgency for 1 one day. Resolved after taking antibiotic for one day. Denies discharge, odor, lesions, rash, flank pain. One partner, no condom use.   Past Medical History:  Diagnosis Date  . Asthma     There are no problems to display for this patient.   Past Surgical History:  Procedure Laterality Date  . BREAST SURGERY      OB History   No obstetric history on file.      Home Medications    Prior to Admission medications   Medication Sig Start Date End Date Taking? Authorizing Provider  fluticasone (FLONASE) 50 MCG/ACT nasal spray Place 2 sprays into both nostrils in the morning and at bedtime. 07/19/20  Yes Sherron Mummert, Elita Boone, NP  acetaminophen (TYLENOL) 500 MG tablet Take 1 tablet (500 mg total) by mouth every 6 (six) hours as needed. 08/01/19   Fawze, Mina A, PA-C  albuterol (VENTOLIN HFA) 108 (90 Base) MCG/ACT inhaler Inhale 2 puffs into the lungs every 4 (four) hours as needed for wheezing or shortness of breath. 07/17/19   Renne Crigler, PA-C  ibuprofen (ADVIL) 600 MG tablet Take 1 tablet (600 mg total) by mouth every 6 (six) hours as needed. 08/01/19   Fawze, Mina A, PA-C  omeprazole (PRILOSEC) 20 MG capsule Take 1 capsule (20 mg total) by mouth daily. 03/29/20   Georgetta Haber, NP  predniSONE  (STERAPRED UNI-PAK 21 TAB) 10 MG (21) TBPK tablet Take by mouth daily. Per box instruction 03/29/20   Georgetta Haber, NP    Family History History reviewed. No pertinent family history.  Social History Social History   Tobacco Use  . Smoking status: Former Smoker    Packs/day: 0.50    Types: Cigarettes    Quit date: 07/17/2019    Years since quitting: 1.0  . Smokeless tobacco: Never Used  Vaping Use  . Vaping Use: Never used  Substance Use Topics  . Alcohol use: Never  . Drug use: Never     Allergies   Patient has no known allergies.   Review of Systems Review of Systems  Defer to HPI  Physical Exam Triage Vital Signs ED Triage Vitals  Enc Vitals Group     BP 07/19/20 0903 132/72     Pulse Rate 07/19/20 0903 64     Resp 07/19/20 0903 19     Temp 07/19/20 0903 99 F (37.2 C)     Temp Source 07/19/20 0903 Oral     SpO2 07/19/20 0903 98 %     Weight --      Height --      Head Circumference --      Peak Flow --      Pain Score 07/19/20 0901 8     Pain Loc --  Pain Edu? --      Excl. in GC? --    No data found.  Updated Vital Signs BP 132/72 (BP Location: Left Arm)   Pulse 64   Temp 99 F (37.2 C) (Oral)   Resp 19   SpO2 98%   Visual Acuity Right Eye Distance:   Left Eye Distance:   Bilateral Distance:    Right Eye Near:   Left Eye Near:    Bilateral Near:     Physical Exam Chaperone present: deferred, self collect vaginal swab.  Constitutional:      Appearance: She is well-developed and normal weight.  HENT:     Head: Normocephalic.     Right Ear: Ear canal normal. A middle ear effusion is present.     Left Ear: Ear canal normal. A middle ear effusion is present.     Nose: No congestion or rhinorrhea.     Mouth/Throat:     Mouth: Mucous membranes are moist.     Pharynx: Posterior oropharyngeal erythema present.     Tonsils: No tonsillar exudate or tonsillar abscesses. 2+ on the right. 0 on the left.  Eyes:     Conjunctiva/sclera:  Conjunctivae normal.     Pupils: Pupils are equal, round, and reactive to light.  Cardiovascular:     Rate and Rhythm: Normal rate and regular rhythm.     Heart sounds: Normal heart sounds.  Pulmonary:     Effort: Pulmonary effort is normal.     Breath sounds: Normal breath sounds.  Musculoskeletal:     Cervical back: Normal range of motion.  Lymphadenopathy:     Cervical: Cervical adenopathy present.  Skin:    General: Skin is warm and dry.  Neurological:     General: No focal deficit present.     Mental Status: She is alert and oriented to person, place, and time.  Psychiatric:        Mood and Affect: Mood normal.        Behavior: Behavior normal.      UC Treatments / Results  Labs (all labs ordered are listed, but only abnormal results are displayed) Labs Reviewed  SARS CORONAVIRUS 2 (TAT 6-24 HRS)  POCT RAPID STREP A, ED / UC  CERVICOVAGINAL ANCILLARY ONLY    EKG   Radiology No results found.  Procedures Procedures (including critical care time)  Medications Ordered in UC Medications - No data to display  Initial Impression / Assessment and Plan / UC Course  I have reviewed the triage vital signs and the nursing notes.  Pertinent labs & imaging results that were available during my care of the patient were reviewed by me and considered in my medical decision making (see chart for details).  Strep phyrangitis Vaginal itching  1. Rapid strep - positive 2. Amoxicillin 500 mg bid for 10 days 3. Fluticasone nasal 1 spray bid prn 4. covid test- pending 5. sti screening pending, will treat per protocols, advised abstinence until labs result and/or treatment complete  Final Clinical Impressions(s) / UC Diagnoses   Final diagnoses:  Vaginal itching  Sore throat     Discharge Instructions     Can use nasal spray once in the morning, once in the evening as needed to help clear sinus passageways  Can attempt salt water gargles, hard candies, hot and cold  liquids, over-the-counter Chloraseptic spray for comfort  COVID test pending 24 hours, you will call if positive  Rapid strep test today was negative  STI screening  pending 2 to 3 days, you will be called if positive for treatment  Do not have sex until lab results, if positive do not have sex until treatment complete, if positive please notify partner so that they may be tested as well     ED Prescriptions    Medication Sig Dispense Auth. Provider   fluticasone (FLONASE) 50 MCG/ACT nasal spray Place 2 sprays into both nostrils in the morning and at bedtime. 9.9 mL Valinda Hoar, NP     PDMP not reviewed this encounter.   Valinda Hoar, NP 07/19/20 1015

## 2020-07-20 ENCOUNTER — Telehealth (HOSPITAL_COMMUNITY): Payer: Self-pay | Admitting: Emergency Medicine

## 2020-07-20 LAB — CERVICOVAGINAL ANCILLARY ONLY
Bacterial Vaginitis (gardnerella): POSITIVE — AB
Candida Glabrata: NEGATIVE
Candida Vaginitis: NEGATIVE
Chlamydia: NEGATIVE
Comment: NEGATIVE
Comment: NEGATIVE
Comment: NEGATIVE
Comment: NEGATIVE
Comment: NEGATIVE
Comment: NORMAL
Neisseria Gonorrhea: NEGATIVE
Trichomonas: NEGATIVE

## 2020-07-20 MED ORDER — METRONIDAZOLE 500 MG PO TABS: 500.0000 mg | ORAL_TABLET | Freq: Two times a day (BID) | ORAL | 0 refills | Status: AC

## 2020-09-13 ENCOUNTER — Ambulatory Visit: Payer: Self-pay | Admitting: Medical

## 2020-10-06 ENCOUNTER — Encounter (HOSPITAL_BASED_OUTPATIENT_CLINIC_OR_DEPARTMENT_OTHER): Payer: Self-pay | Admitting: Emergency Medicine

## 2020-10-06 ENCOUNTER — Emergency Department (HOSPITAL_BASED_OUTPATIENT_CLINIC_OR_DEPARTMENT_OTHER)
Admission: EM | Admit: 2020-10-06 | Discharge: 2020-10-06 | Disposition: A | Discharge status: Home / Self Care | Payer: Medicaid Other | Attending: Emergency Medicine | Admitting: Emergency Medicine

## 2020-10-06 ENCOUNTER — Other Ambulatory Visit: Payer: Self-pay

## 2020-10-06 DIAGNOSIS — J45909 Unspecified asthma, uncomplicated: Secondary | ICD-10-CM | POA: Insufficient documentation

## 2020-10-06 DIAGNOSIS — U071 COVID-19: Secondary | ICD-10-CM | POA: Insufficient documentation

## 2020-10-06 DIAGNOSIS — Z87891 Personal history of nicotine dependence: Secondary | ICD-10-CM | POA: Insufficient documentation

## 2020-10-06 DIAGNOSIS — Z20822 Contact with and (suspected) exposure to covid-19: Secondary | ICD-10-CM

## 2020-10-06 LAB — RESP PANEL BY RT-PCR (FLU A&B, COVID) ARPGX2
Influenza A by PCR: NEGATIVE
Influenza B by PCR: NEGATIVE
SARS Coronavirus 2 by RT PCR: POSITIVE — AB

## 2020-10-06 NOTE — Discharge Instructions (Addendum)
Please self-isolate until COVID-19 testing results.   If COVID-19 testing is positive:  Patient and immediate family living in the household should self-isolate per CDC guidelines. If family members are wanting covid test and are without symptoms there are multiple community testing sites. You should be able to find local testing sites online or call and ask your primary care doctor.  -Tylenol should be given for fever and body aches. Please give as directed on the bottle.  -Encourage fluid intake so child does not get dehydrated.  Monitor for symptoms including difficulty breathing, vomiting/diarrhea, lethargy, or any other concerning symptoms.    Should you develop these symptoms they should return to the Pediatric ED and inform staff of +Covid status. Please continue preventive measures, handwashing, social distancing, and mask wearing. Inform family and friends, so they can self-quarantine for per CDC guidelines, get tested, and monitor for symptoms.     If covid test is negative then it is likely you still have a illness and the treatment is the same as above.  Isolation is not needed if covid negative.

## 2020-10-06 NOTE — ED Provider Notes (Signed)
[facility] EMERGENCY DEPARTMENT Provider Note   CSN: [id number] Arrival date & time: 10/06/20  1004     History Chief Complaint  Patient presents with   Abdominal Pain   Nasal Congestion    Alyssa Shepard is a 35 y.o. female with past medical history significant for asthma presents to emergency department today with chief complaint of abdominal pain and nasal congestion.  Patient states she has had nasal congestion and loss of sense of smell x3 days.  She has been taking over-the-counter cold and flu medicine with symptom improvement.  She states she had tactile fevers at home the first 2 days of symptoms.  She also noticed that she had some mild intermittent abdominal pain since yesterday.  She is unable to further describe it.  She states she has had pain like this in the past that resolved spontaneously without being evaluated for it.  She denies any abdominal pain at this moment.  She admits to having an IUD and states she is not sexually active.  She denies chance of pregnancy. She denies any chills, sinus pressure, otalgia, chest pain, cough, shortness of breath, wheezing, nausea, emesis, pelvic pain, vaginal discharge, abnormal vaginal bleeding, urinary symptoms, diarrhea..  She states her periods are regular because she has an IUD.    Past Medical History:  Diagnosis Date   Asthma     There are no problems to display for this patient.   Past Surgical History:  Procedure Laterality Date   BREAST SURGERY       OB History   No obstetric history on file.     No family history on file.  Social History   Tobacco Use   Smoking status: Former    Packs/day: 0.50    Types: Cigarettes    Quit date: 07/17/2019    Years since quitting: 1.2   Smokeless tobacco: Never  Vaping Use   Vaping Use: Never used  Substance Use Topics   Alcohol use: Never   Drug use: Never    Home Medications Prior to Admission medications   Medication Sig Start Date End Date  Taking? Authorizing Provider  acetaminophen (TYLENOL) 500 MG tablet Take 1 tablet (500 mg total) by mouth every 6 (six) hours as needed. 08/01/19   Fawze, Mina A, PA-C  albuterol (VENTOLIN HFA) 108 (90 Base) MCG/ACT inhaler Inhale 2 puffs into the lungs every 4 (four) hours as needed for wheezing or shortness of breath. 07/17/19   Renne Crigler, PA-C  amoxicillin (AMOXIL) 500 MG capsule Take 1 capsule (500 mg total) by mouth 2 (two) times daily. 07/19/20   White, Elita Boone, NP  fluticasone (FLONASE) 50 MCG/ACT nasal spray Place 2 sprays into both nostrils in the morning and at bedtime. 07/19/20   White, Elita Boone, NP  ibuprofen (ADVIL) 600 MG tablet Take 1 tablet (600 mg total) by mouth every 6 (six) hours as needed. 08/01/19   Fawze, Mina A, PA-C  metroNIDAZOLE (FLAGYL) 500 MG tablet Take 1 tablet (500 mg total) by mouth 2 (two) times daily. 07/20/20   Merrilee Jansky, MD  omeprazole (PRILOSEC) 20 MG capsule Take 1 capsule (20 mg total) by mouth daily. 03/29/20   Georgetta Haber, NP  predniSONE (STERAPRED UNI-PAK 21 TAB) 10 MG (21) TBPK tablet Take by mouth daily. Per box instruction 03/29/20   Georgetta Haber, NP    Allergies    Patient has no known allergies.  Review of Systems   Review of Systems All other  systems are reviewed and are negative for acute change except as noted in the HPI.  Physical Exam Updated Vital Signs BP 114/82 (BP Location: Right Arm)   Pulse 79   Temp 98.3 F (36.8 C) (Oral)   Resp 18   Ht 5\' 3"  (1.6 m)   Wt 105.2 kg   LMP 09/20/2020   SpO2 99%   BMI 41.10 kg/m   Physical Exam Vitals and nursing note reviewed.  Constitutional:      General: She is not in acute distress.    Appearance: She is not ill-appearing.  HENT:     Head: Normocephalic and atraumatic.     Comments: No sinus or temporal tenderness    Right Ear: Tympanic membrane and external ear normal.     Left Ear: Tympanic membrane and external ear normal.     Nose: Congestion present.      Mouth/Throat:     Mouth: Mucous membranes are moist.     Pharynx: Oropharynx is clear. No oropharyngeal exudate or posterior oropharyngeal erythema.  Eyes:     General: No scleral icterus.       Right eye: No discharge.        Left eye: No discharge.     Extraocular Movements: Extraocular movements intact.     Conjunctiva/sclera: Conjunctivae normal.     Pupils: Pupils are equal, round, and reactive to light.  Neck:     Vascular: No JVD.  Cardiovascular:     Rate and Rhythm: Normal rate and regular rhythm.     Pulses: Normal pulses.          Radial pulses are 2+ on the right side and 2+ on the left side.     Heart sounds: Normal heart sounds.  Pulmonary:     Comments: Lungs clear to auscultation in all fields. Symmetric chest rise. No wheezing, rales, or rhonchi. Abdominal:     Tenderness: There is no right CVA tenderness or left CVA tenderness.     Comments: Abdomen is soft, non-distended, and non-tender in all quadrants. No rigidity, no guarding. No peritoneal signs.  Musculoskeletal:        General: Normal range of motion.     Cervical back: Normal range of motion.  Skin:    General: Skin is warm and dry.     Capillary Refill: Capillary refill takes less than 2 seconds.  Neurological:     Mental Status: She is oriented to person, place, and time.     GCS: GCS eye subscore is 4. GCS verbal subscore is 5. GCS motor subscore is 6.     Comments: Fluent speech, no facial droop.  Psychiatric:        Behavior: Behavior normal.    ED Results / Procedures / Treatments   Labs (all labs ordered are listed, but only abnormal results are displayed) Labs Reviewed  RESP PANEL BY RT-PCR (FLU A&B, COVID) [id number]    EKG None  Radiology No results found.  Procedures Procedures   Medications Ordered in ED Medications - No data to display  ED Course  I have reviewed the triage vital signs and the nursing notes.  Pertinent labs & imaging results that were available during my  care of the patient were reviewed by me and considered in my medical decision making (see chart for details).    MDM Rules/Calculators/A&P  History provided by patient with additional history obtained from chart review.    Presenting with URI symptoms and abdominal pain.  Patient is well-appearing in no acute distress.  She is afebrile, hemodynamically stable.  On exam patient has nasal congestion.  No signs of otitis media or strep.  She has no abdominal tenderness.  No peritoneal signs.  COVID test is in process.  Engaged in shared decision-making with patient.  She states since she has no abdominal pain currently she does not want to stay for a work-up of this.  Discussed the importance of close follow-up with PCP for recheck or worsening symptoms that should cause her to return to the ER.  Patient knows to follow-up on MyChart for COVID test results.  Discussed symptomatic home care.  Discharged home in stable condition.   Portions of this note were generated with Scientist, clinical (histocompatibility and immunogenetics). Dictation errors may occur despite best attempts at proofreading.  Final Clinical Impression(s) / ED Diagnoses Final diagnoses:  COVID-19 virus test result unknown    Rx / DC Orders ED Discharge Orders     None        Kandice Hams 10/06/20 1210    Terrilee Files, MD 10/06/20 1810

## 2020-10-06 NOTE — ED Triage Notes (Signed)
Requesting covid test.  Reports loss of smell and stuffy nose since Tuesday.

## 2021-10-09 IMAGING — CR DG CHEST 2V
2 series · 2 of 2 positions shown · non-contrast
Comparison: None.

CLINICAL DATA: Sudden onset of mid-sternal chest pain just PTA
albuterol treatment here Hx asthma

EXAM:
CHEST - 2 VIEW

[w chest pa]
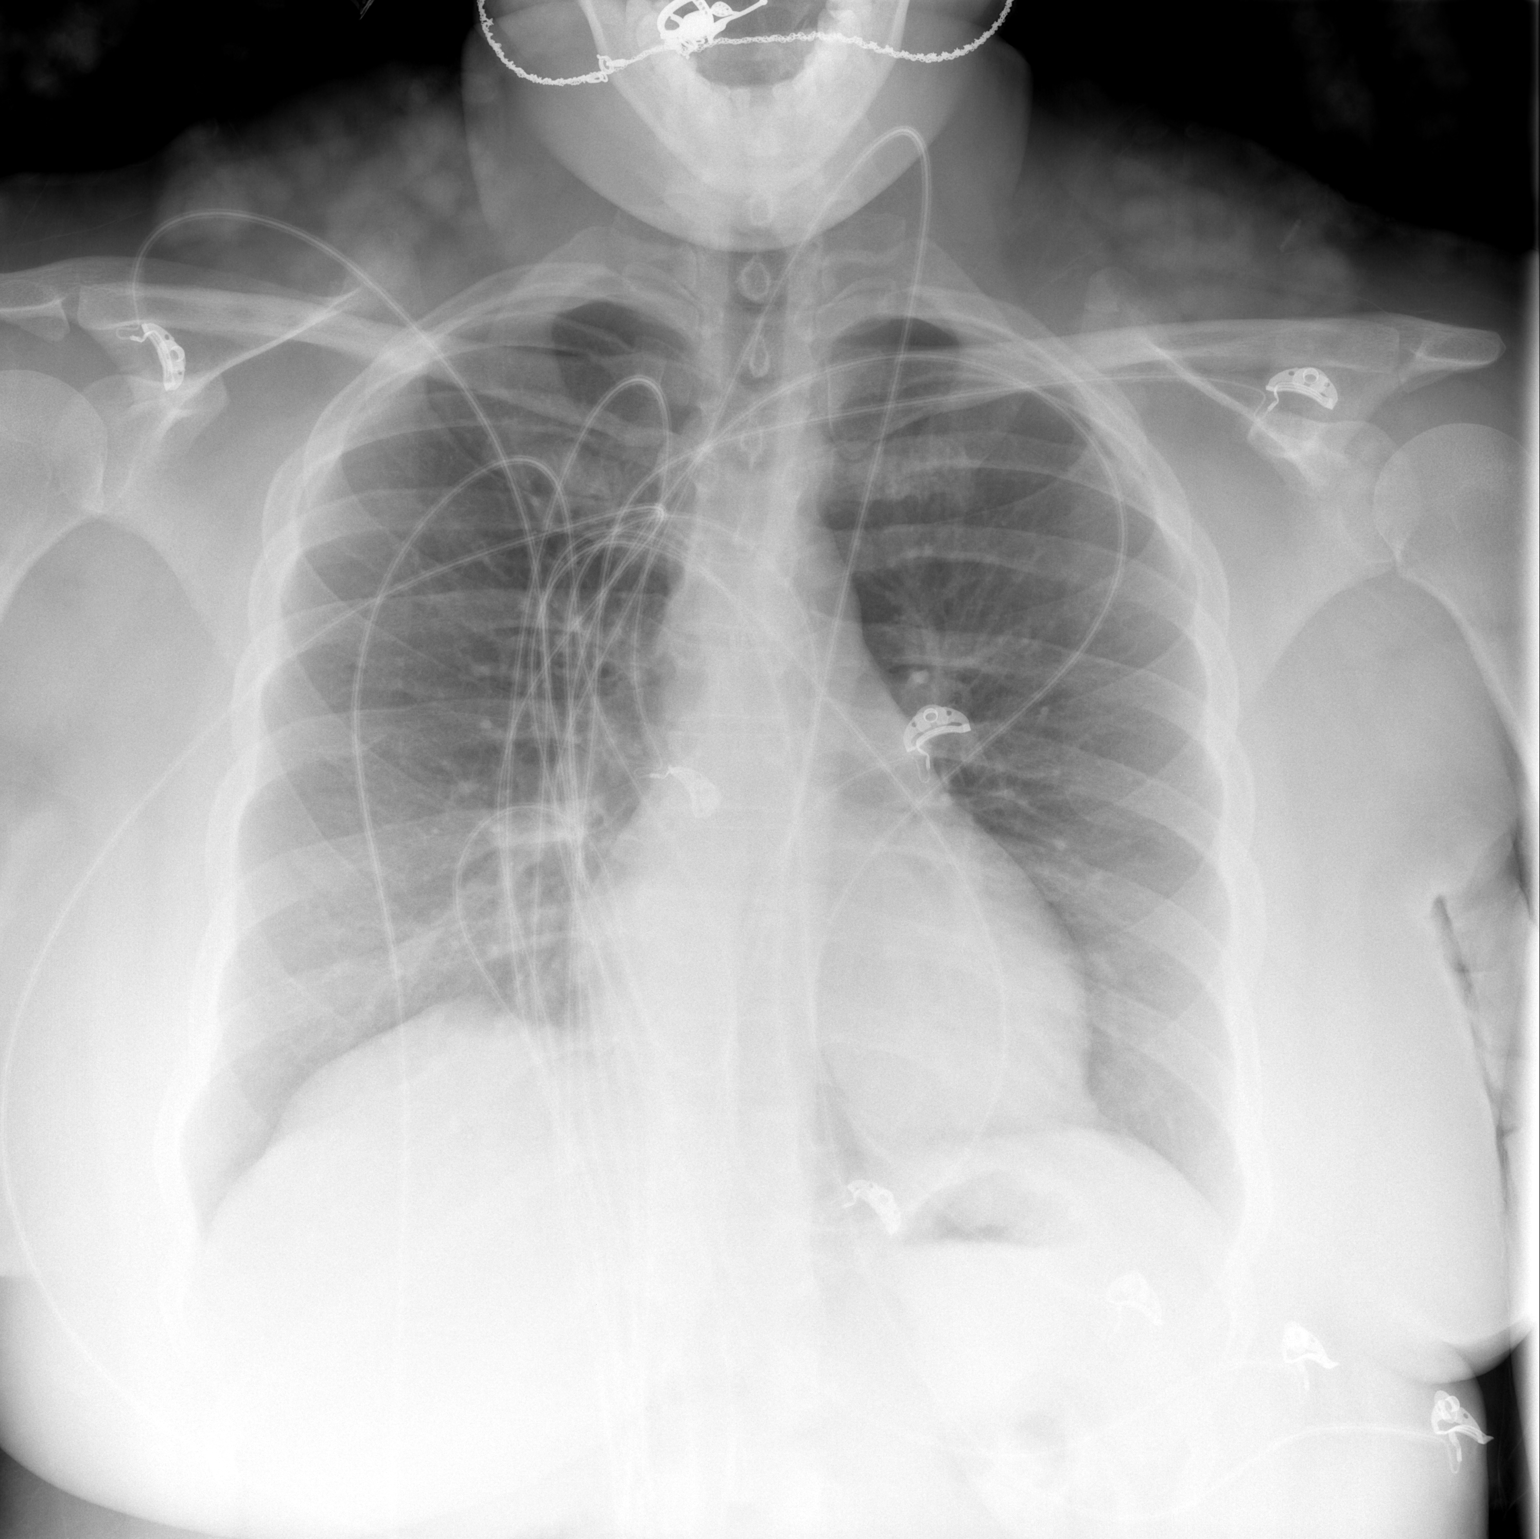

[w chest lat]
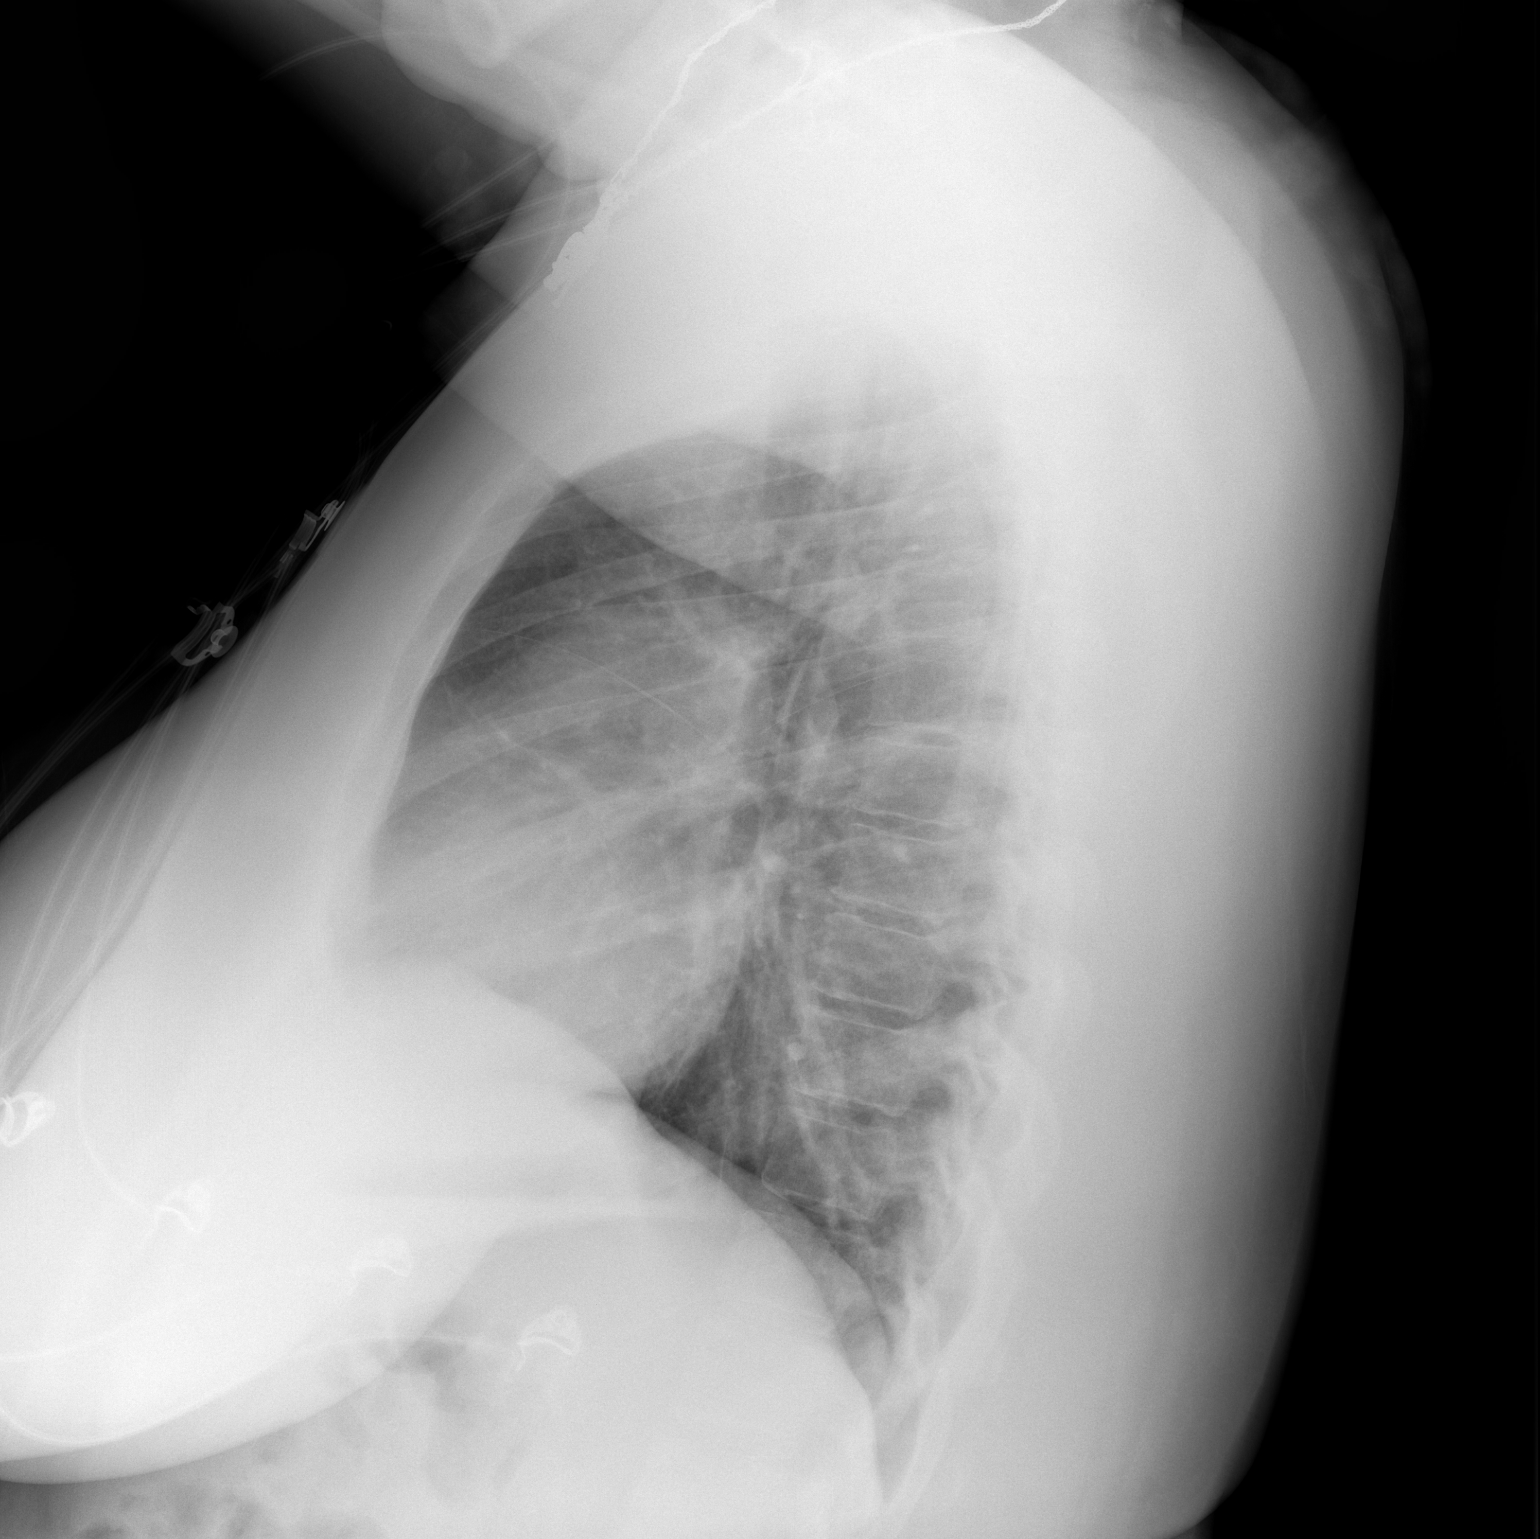

[2 of 2 positions shown; findings below may reference images not displayed]

FINDINGS: The heart size and mediastinal contours are within normal limits.
The lungs are clear. No pneumothorax or pleural effusion. The
visualized skeletal structures are unremarkable.
IMPRESSION: No acute cardiopulmonary finding.

## 2021-10-26 IMAGING — MG DIGITAL DIAGNOSTIC BILAT W/ TOMO W/ CAD
8 of 14 series · 8 of 40 positions shown · non-contrast
Comparison: None.

CLINICAL DATA: Patient presents for evaluation left breast abscess.

EXAM:
DIGITAL DIAGNOSTIC BILATERAL MAMMOGRAM WITH CAD AND TOMO
ULTRASOUND BILATERAL BREAST

[L MLO synth-2D]
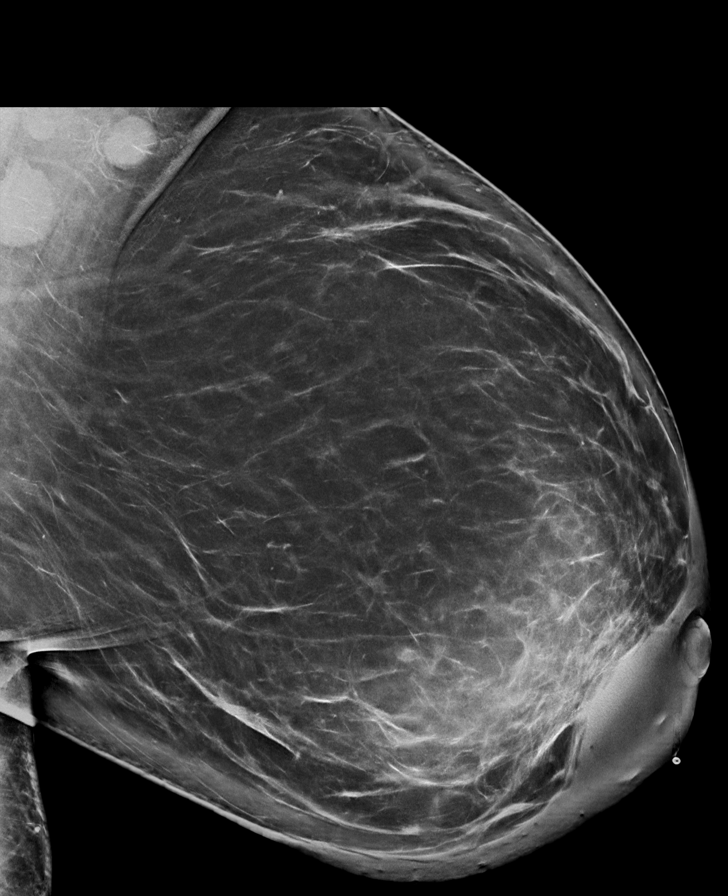

[R CC synth-2D (1 of 2)]
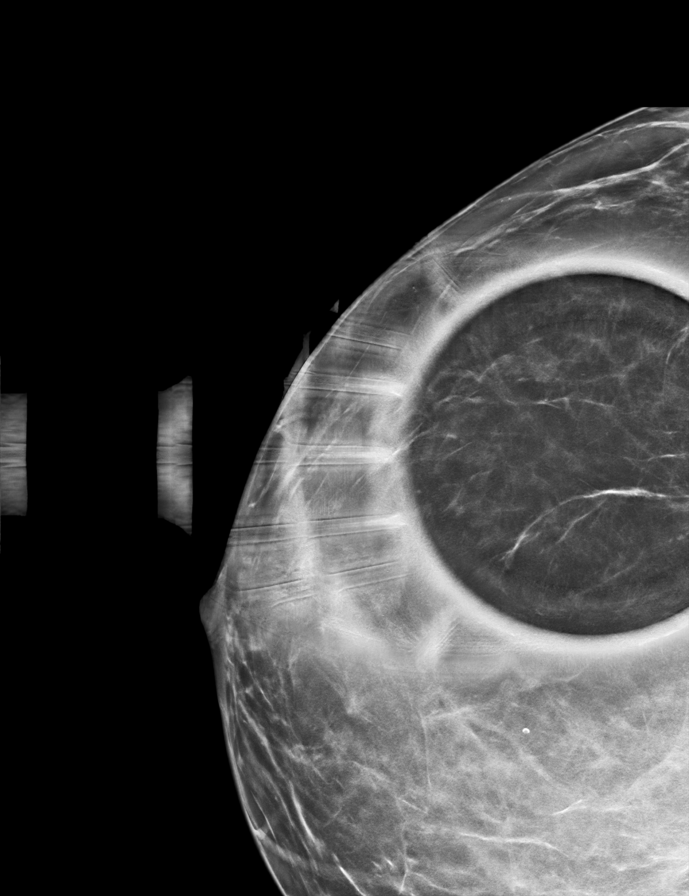

[L TAN synth-2D]
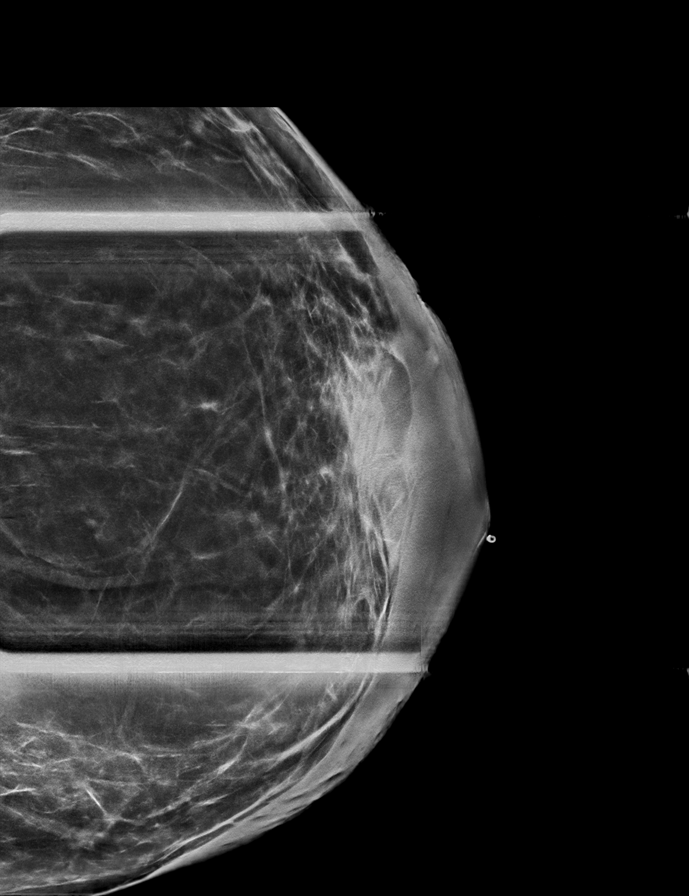

[L CC synth-2D]
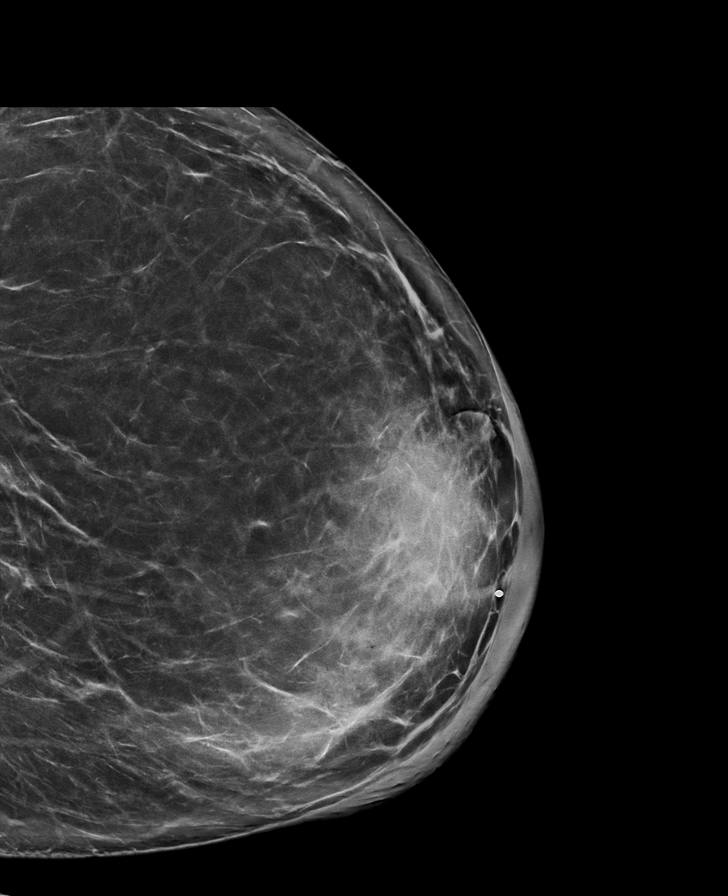

[R ML synth-2D]
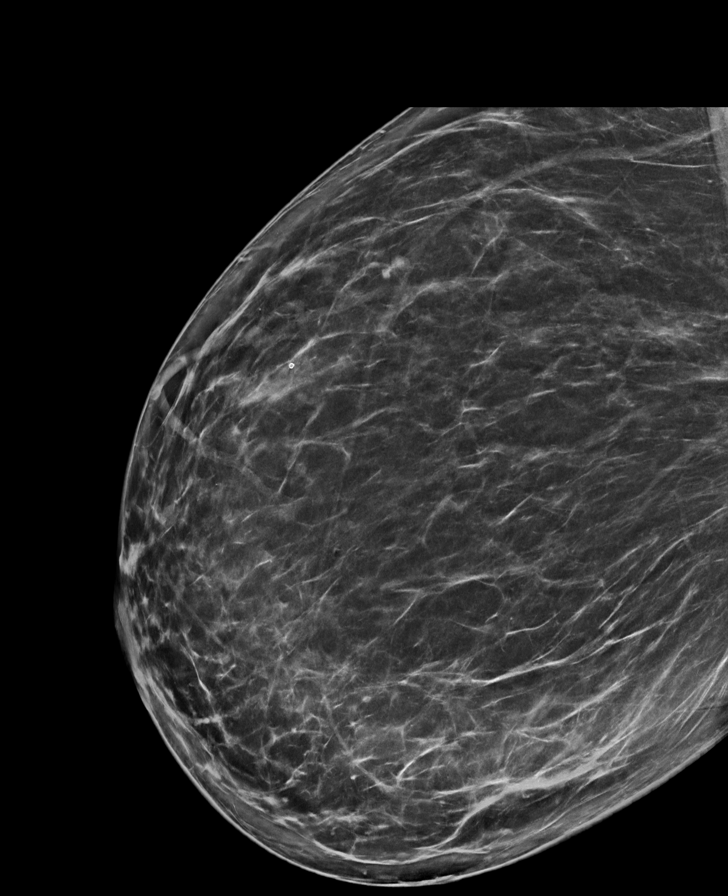

[R MLO synth-2D]
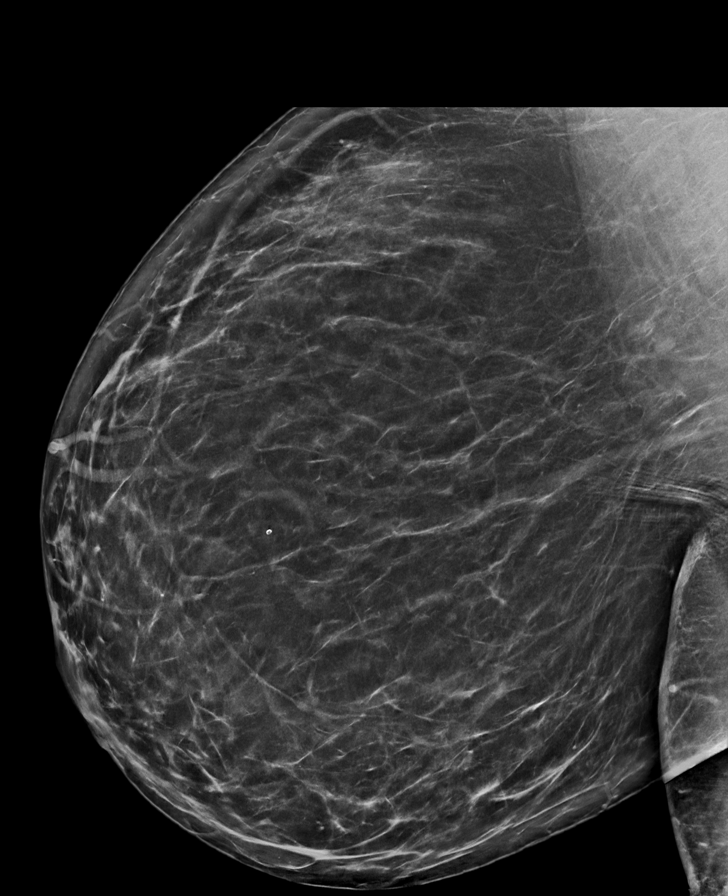

[R CC synth-2D (2 of 2)]
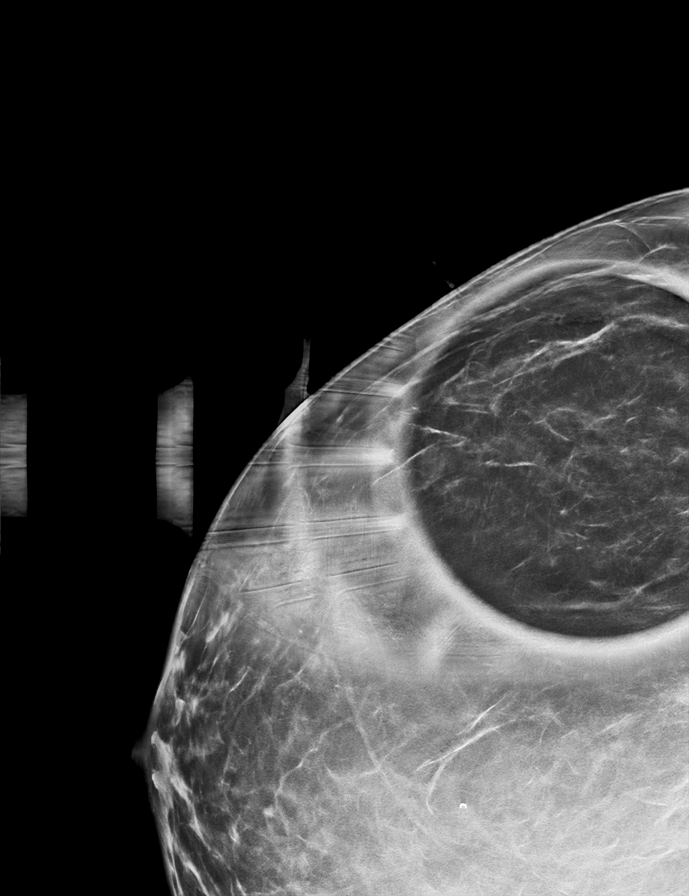

[L CC tomo · tomo slice 53/104.0]
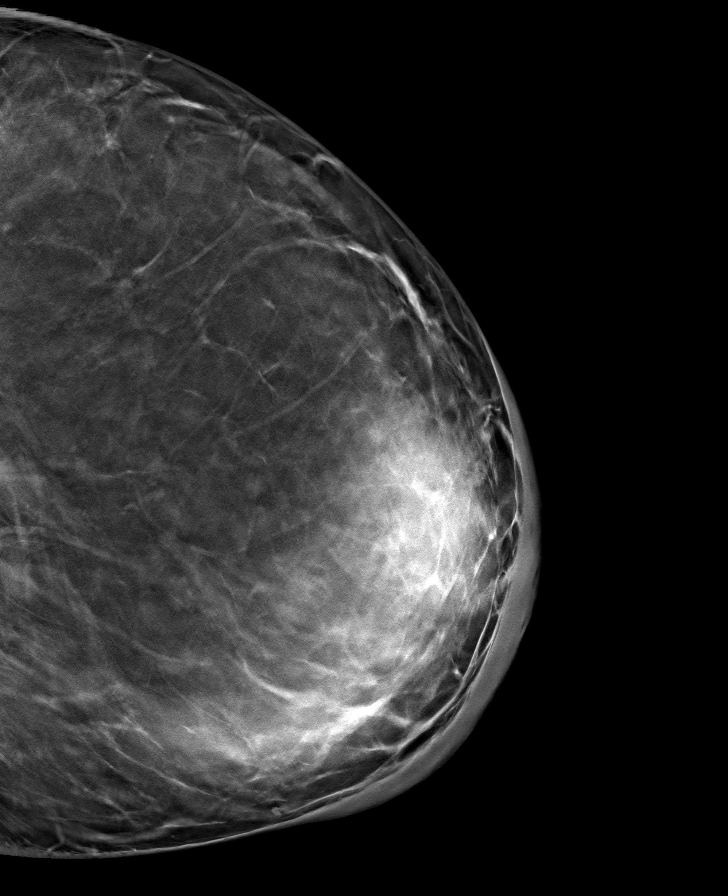

[8 of 40 positions shown; findings below may reference images not displayed]

ACR Breast Density Category c: The breast tissue is heterogeneously
dense, which may obscure small masses.
FINDINGS: Underlying the palpable marker within the anterior left breast is an
oval-shaped mass. There is associated skin thickening overlying the
left breast mass. Multiple thickened and enlarged lymph nodes are
demonstrated within the left axilla.

Within the upper-outer right breast there is a small oval
circumscribed mass, further evaluated with spot compression views.

Mammographic images were processed with CAD.

On physical exam, there is a large mass within the medial
periareolar left breast. There is breakdown of the overlying skin.

Targeted ultrasound is performed, showing a large 6.7 x 2.0 x 5.7 cm
complex collection located predominately within the skin of the left
breast 7-9 o'clock position extending to 4 cm from nipple.

Multiple cortically thickened left axillary lymph nodes are
demonstrated.

Within the right breast 10 o'clock position 12 cm from nipple there
is a 3 x 3 x 4 mm mildly complicated cyst.

Within the right breast 10 o'clock position 12 cm from nipple there
is an adjacent 5 x 2 x 4 mm mildly complicated cyst.

Within the right breast 10 o'clock position 7 cm from nipple there
is a 7 x 3 x 6 mm cluster of cysts.
IMPRESSION: 1. Findings most compatible with large abscess involving the
anterior aspect of the left breast, predominately located within the
skin. There is overlying skin breakdown.
2. Cortically thickened left axillary lymph nodes likely reactive in
etiology.
3. Probably benign right breast masses favored to represent
complicated cysts and clusters of cysts.

RECOMMENDATION:
Surgical incision and drainage of the left breast abscess given that
the collection is predominately located within the skin and there is
active skin breakdown. Patient will be sent for surgical
consultation for I and D.

Continue antibiotic therapy.

Continued imaging and clinical evaluation to ensure resolution of
left breast abscess and likely reactive left axillary adenopathy.

Right breast diagnostic mammogram and ultrasound in 6 months to
reassess probably benign right breast masses.

I have discussed the findings and recommendations with the patient.
If applicable, a reminder letter will be sent to the patient
regarding the next appointment.

BI-RADS CATEGORY  3: Probably benign.
